# Patient Record
Sex: Female | Born: 1995 | Race: Black or African American | Hispanic: No | Marital: Single | Smoking: Never smoker
Health system: Southern US, Community
[De-identification: ages and names within clinical notes are randomized; demographics above are authoritative.]

## PROBLEM LIST (undated history)

## (undated) ENCOUNTER — Ambulatory Visit (HOSPITAL_COMMUNITY): Admission: EM | Discharge status: Against Medical Advice | Payer: No Typology Code available for payment source

## (undated) DIAGNOSIS — H548 Legal blindness, as defined in USA: Secondary | ICD-10-CM

## (undated) HISTORY — PX: WISDOM TOOTH EXTRACTION: SHX21

---
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
## 2017-07-10 ENCOUNTER — Emergency Department
Admission: EM | Admit: 2017-07-10 | Discharge: 2017-07-11 | Disposition: A | Discharge status: Home / Self Care | Payer: Self-pay | Attending: Emergency Medicine | Admitting: Emergency Medicine

## 2017-07-10 ENCOUNTER — Encounter: Payer: Self-pay | Admitting: Emergency Medicine

## 2017-07-10 DIAGNOSIS — R112 Nausea with vomiting, unspecified: Secondary | ICD-10-CM | POA: Insufficient documentation

## 2017-07-10 DIAGNOSIS — N390 Urinary tract infection, site not specified: Secondary | ICD-10-CM | POA: Insufficient documentation

## 2017-07-10 DIAGNOSIS — R1030 Lower abdominal pain, unspecified: Secondary | ICD-10-CM

## 2017-07-10 DIAGNOSIS — R197 Diarrhea, unspecified: Secondary | ICD-10-CM | POA: Insufficient documentation

## 2017-07-10 HISTORY — DX: Legal blindness, as defined in USA: H54.8

## 2017-07-10 LAB — URINALYSIS, COMPLETE (UACMP) WITH MICROSCOPIC
Bacteria, UA: NONE SEEN
Bilirubin Urine: NEGATIVE
GLUCOSE, UA: NEGATIVE mg/dL
Hgb urine dipstick: NEGATIVE
KETONES UR: NEGATIVE mg/dL
Nitrite: NEGATIVE
PH: 6 (ref 5.0–8.0)
Protein, ur: NEGATIVE mg/dL
RBC / HPF: NONE SEEN RBC/hpf (ref 0–5)
Specific Gravity, Urine: 1.014 (ref 1.005–1.030)

## 2017-07-10 LAB — CBC
HCT: 36.3 % (ref 35.0–47.0)
Hemoglobin: 12.1 g/dL (ref 12.0–16.0)
MCH: 26.1 pg (ref 26.0–34.0)
MCHC: 33.4 g/dL (ref 32.0–36.0)
MCV: 78.3 fL — AB (ref 80.0–100.0)
PLATELETS: 340 10*3/uL (ref 150–440)
RBC: 4.63 MIL/uL (ref 3.80–5.20)
RDW: 16.3 % — AB (ref 11.5–14.5)
WBC: 6.7 10*3/uL (ref 3.6–11.0)

## 2017-07-10 LAB — COMPREHENSIVE METABOLIC PANEL
ALT: 12 U/L — AB (ref 14–54)
AST: 21 U/L (ref 15–41)
Albumin: 4.6 g/dL (ref 3.5–5.0)
Alkaline Phosphatase: 62 U/L (ref 38–126)
Anion gap: 12 (ref 5–15)
BILIRUBIN TOTAL: 0.3 mg/dL (ref 0.3–1.2)
BUN: 20 mg/dL (ref 6–20)
CALCIUM: 9.5 mg/dL (ref 8.9–10.3)
CO2: 21 mmol/L — ABNORMAL LOW (ref 22–32)
CREATININE: 0.96 mg/dL (ref 0.44–1.00)
Chloride: 105 mmol/L (ref 101–111)
GFR calc Af Amer: >60 mL/min (ref >60)
Glucose, Bld: 97 mg/dL (ref 65–99)
Potassium: 3.9 mmol/L (ref 3.5–5.1)
Sodium: 138 mmol/L (ref 135–145)
TOTAL PROTEIN: 9.3 g/dL — AB (ref 6.5–8.1)

## 2017-07-10 LAB — LIPASE, BLOOD: Lipase: 24 U/L (ref 11–51)

## 2017-07-10 NOTE — ED Triage Notes (Signed)
Patient with complaint of vomiting times 4 today. Patient also states that she has had intermittent central abdominal pain times 2 weeks. Patient also states that about 2 weeks ago she had some bleeding in her stool but has not has any blood in her stool since.

## 2017-07-10 NOTE — ED Triage Notes (Signed)
Patient with complaint of sore throat and congestion that started yesterday. Patient reports that this morning with fever of 104, diarrhea and generalized abdominal pain.

## 2017-07-10 NOTE — ED Triage Notes (Signed)
Patient ambulatory to stat desk without any difficulty with c/o N/V/D.

## 2017-07-11 LAB — PREGNANCY, URINE: PREG TEST UR: NEGATIVE

## 2017-07-11 MED ORDER — ONDANSETRON 4 MG PO TBDP: 4.0000 mg | ORAL_TABLET | Freq: Three times a day (TID) | ORAL | 0 refills | Status: DC | PRN

## 2017-07-11 MED ORDER — CEPHALEXIN 500 MG PO CAPS
500.0000 mg | ORAL_CAPSULE | Freq: Once | ORAL | Status: AC
  Administered 2017-07-11: 500 mg via ORAL
  Filled 2017-07-11: qty 1

## 2017-07-11 MED ORDER — CEPHALEXIN 500 MG PO CAPS: 500.0000 mg | ORAL_CAPSULE | Freq: Three times a day (TID) | ORAL | 0 refills | Status: DC

## 2017-07-11 MED ORDER — ONDANSETRON 4 MG PO TBDP
4.0000 mg | ORAL_TABLET | Freq: Once | ORAL | Status: AC
  Administered 2017-07-11: 4 mg via ORAL
  Filled 2017-07-11: qty 1

## 2017-07-11 NOTE — Discharge Instructions (Signed)
1. Take antibiotic as prescribed (Keflex 500 mg 3 times daily 7 days). 2. You may take nausea medicine as needed (Zofran #20). 3. Clear liquids 12 hours, then BRAT diet 3 days, then slowly advance diet as tolerated. 4. Return to the ER for worsening symptoms, persistent vomiting, difficulty breathing or other concerns.

## 2017-07-11 NOTE — ED Provider Notes (Signed)
[facility] Emergency Department Provider Note   ____________________________________________   First MD Initiated Contact with Patient 07/11/17 0015     (approximate)  I have reviewed the triage vital signs and the nursing notes.   HISTORY  Chief Complaint Emesis and Abdominal Pain    HPI Tiffany Flynn is a 21 y.o. female who presents to the ED from home with a chief complaint of N/V/D.patient reports 4 episodes of vomiting and 4 episodes of diarrhea since yesterday morning. Both tapered off in the evening and she was able to eat chicken nuggets approximately 7 PM. She had to leave work at [employer] due to her illness. Complains of suprapubic abdominal pain. Notes that over the past 2 weeks she has had intermittent abdominal cramping. 2 weeks ago she had multiple episodes of loose stool and some red bloody streaks in her stool towards the end of her GI illness. Denies blood in her stool since. Denies associated fever, chills, chest pain, shortness of breath, vaginal discharge or bleeding. Has noted some dysuria. Denies recent travel, trauma or antibiotic use. Of note, I reviewed the triage note complaint of sore throat, congestion and fever. Patient denies all of the above. She is unsure why the triage note registered that complaint.   Past Medical History:  Diagnosis Date  . Legally blind in left eye, as defined in [place]     There are no active problems to display for this patient.   Past Surgical History:  Procedure Laterality Date  . WISDOM TOOTH EXTRACTION      Prior to Admission medications   Medication Sig Start Date End Date Taking? Authorizing Provider  benzonatate (TESSALON PERLES) 100 MG capsule Take 1-2 capsules every 8 hours prn cough 08/03/16   Tommi Rumps, PA-C    Allergies Strawberry (diagnostic)  No family history on file.  Social History Social History  Substance Use Topics  . Smoking status: Never Smoker  . Smokeless  tobacco: Never Used  . Alcohol use No    Review of Systems  Constitutional: No fever/chills. Eyes: No visual changes. ENT: No sore throat. Cardiovascular: Denies chest pain. Respiratory: Denies shortness of breath. Gastrointestinal: positive for abdominal pain, nausea, vomiting and diarrhea.  No constipation. Genitourinary: positive for dysuria. Musculoskeletal: Negative for back pain. Skin: Negative for rash. Neurological: Negative for headaches, focal weakness or numbness.   ____________________________________________   PHYSICAL EXAM:  VITAL SIGNS: ED Triage Vitals  Enc Vitals Group     BP 07/10/17 2029 130/84     Pulse Rate 07/10/17 2029 (!) 108     Resp 07/10/17 2029 18     Temp 07/10/17 2029 98.6 F (37 C)     Temp src --      SpO2 07/10/17 2029 98 %     Weight 07/10/17 2030 220 lb (99.8 kg)     Height 07/10/17 2030  (1.651 m)     Head Circumference --      Peak Flow --      Pain Score 07/10/17 2029 4     Pain Loc --      Pain Edu? --      Excl. in GC? --     Constitutional: Alert and oriented. Well appearing and in no acute distress. Eyes: Conjunctivae are normal. PERRL. EOMI. Head: Atraumatic. Nose: No congestion/rhinnorhea. Mouth/Throat: Mucous membranes are moist.  Oropharynx non-erythematous. Neck: No stridor.   Cardiovascular: Normal rate, regular rhythm. Grossly normal heart sounds.  Good peripheral  circulation. Respiratory: Normal respiratory effort.  No retractions. Lungs CTAB. Gastrointestinal: Soft and nontender to light and deep palpation. No distention. No abdominal bruits. No CVA tenderness. Musculoskeletal: No lower extremity tenderness nor edema.  No joint effusions. Neurologic:  Normal speech and language. No gross focal neurologic deficits are appreciated. No gait instability. Skin:  Skin is warm, dry and intact. No rash noted. Psychiatric: Mood and affect are normal. Speech and behavior are  normal.  ____________________________________________   LABS (all labs ordered are listed, but only abnormal results are displayed)  Labs Reviewed  COMPREHENSIVE METABOLIC PANEL - Abnormal; Notable for the following:       Result Value   CO2 21 (*)    Total Protein 9.3 (*)    ALT 12 (*)    All other components within normal limits  CBC - Abnormal; Notable for the following:    MCV 78.3 (*)    RDW 16.3 (*)    All other components within normal limits  URINALYSIS, COMPLETE (UACMP) WITH MICROSCOPIC - Abnormal; Notable for the following:    Color, Urine YELLOW (*)    APPearance HAZY (*)    Leukocytes, UA MODERATE (*)    Squamous Epithelial / LPF 0-5 (*)    All other components within normal limits  LIPASE, BLOOD  POC URINE PREG, ED   ____________________________________________  EKG  None ____________________________________________  RADIOLOGY  No results found.  ____________________________________________   PROCEDURES  Procedure(s) performed: None  Procedures  Critical Care performed: No  ____________________________________________   INITIAL IMPRESSION / ASSESSMENT AND PLAN / ED COURSE    21 year old female who presents with N/V/D without abdominal tenderness on examination. Differential diagnosis includes, but is not limited to, ovarian cyst, ovarian torsion, acute appendicitis, diverticulitis, urinary tract infection/pyelonephritis, endometriosis, bowel obstruction, colitis, renal colic, gastroenteritis, hernia, pregnancy related pain including ectopic pregnancy, etc. Laboratory and urinalysis results remarkable for moderate leukocytes and TNTC WBC. She denies pregnancy. No pain at McBurney's point. Patient is otherwise well appearing on examination. Will initiate antibiotic treatment with Keflex. She was able to tolerate chicken nuggets for dinner without emesis. Will discharge home with prescriptions for Keflex and Zofran, and patient will follow-up  closely with PCP as needed. Strict return precautions given. Patient and friend verbalize understanding and agrees with plan of care.      ____________________________________________   FINAL CLINICAL IMPRESSION(S) / ED DIAGNOSES  Final diagnoses:  Nausea vomiting and diarrhea  Lower urinary tract infectious disease  Lower abdominal pain      NEW MEDICATIONS STARTED DURING THIS VISIT:  New Prescriptions   No medications on file     Note:  This document was prepared using Dragon voice recognition software and may include unintentional dictation errors.    Irean Hong, MD 07/11/17 [phone]

## 2018-05-24 ENCOUNTER — Ambulatory Visit (HOSPITAL_COMMUNITY)
Admission: EM | Admit: 2018-05-24 | Discharge: 2018-05-24 | Disposition: A | Discharge status: Home / Self Care | Payer: Self-pay | Attending: Family Medicine | Admitting: Family Medicine

## 2018-05-24 ENCOUNTER — Encounter (HOSPITAL_COMMUNITY): Payer: Self-pay

## 2018-05-24 DIAGNOSIS — L500 Allergic urticaria: Secondary | ICD-10-CM

## 2018-05-24 DIAGNOSIS — T7840XA Allergy, unspecified, initial encounter: Secondary | ICD-10-CM

## 2018-05-24 MED ORDER — PREDNISONE 50 MG PO TABS: 50.0000 mg | ORAL_TABLET | Freq: Every day | ORAL | 0 refills | Status: AC

## 2018-05-24 MED ORDER — METHYLPREDNISOLONE SODIUM SUCC 125 MG IJ SOLR
125.0000 mg | Freq: Once | INTRAMUSCULAR | Status: AC
  Administered 2018-05-24: 125 mg via INTRAMUSCULAR

## 2018-05-24 MED ORDER — METHYLPREDNISOLONE SODIUM SUCC 125 MG IJ SOLR
INTRAMUSCULAR | Status: AC
  Filled 2018-05-24: qty 2

## 2018-05-24 NOTE — ED Provider Notes (Signed)
[facility]   [id number] 05/24/18 Arrival Time: 1734  CC: SKIN COMPLAINT  SUBJECTIVE:  Tiffany Flynn is a 22 y.o. female who presents with improving hives and facial swelling that began 5 days ago.  Denies known trigger, change in products she uses on a daily basis, or starting a new medication.  Has tried benadryl with relief.  Denies worsening symptoms.  Reports similar symptoms in the past that resolved with benadryl.   Complains decreased appetite.  Denies fever, chills, nausea, vomiting, erythema, lip swelling, tongue swelling, lips tingling, tongue tingling, throat swelling, dyspnea, SOB, chest pain, abdominal pain, changes in bowel or bladder function.    ROS: As per HPI.  Past Medical History:  Diagnosis Date  . Legally blind in left eye, as defined in [place]    Past Surgical History:  Procedure Laterality Date  . WISDOM TOOTH EXTRACTION     Allergies  Allergen Reactions  . Strawberry (Diagnostic)   . Tomato    No current facility-administered medications on file prior to encounter.    No current outpatient medications on file prior to encounter.   Social History   Socioeconomic History  . Marital status: Single    Spouse name: Not on file  . Number of children: Not on file  . Years of education: Not on file  . Highest education level: Not on file  Occupational History  . Not on file  Social Needs  . Financial resource strain: Not on file  . Food insecurity:    Worry: Not on file    Inability: Not on file  . Transportation needs:    Medical: Not on file    Non-medical: Not on file  Tobacco Use  . Smoking status: Never Smoker  . Smokeless tobacco: Never Used  Substance and Sexual Activity  . Alcohol use: No  . Drug use: No  . Sexual activity: Not on file  Lifestyle  . Physical activity:    Days per week: Not on file    Minutes per session: Not on file  . Stress: Not on file  Relationships  . Social connections:    Talks on phone: Not on  file    Gets together: Not on file    Attends religious service: Not on file    Active member of club or organization: Not on file    Attends meetings of clubs or organizations: Not on file    Relationship status: Not on file  . Intimate partner violence:    Fear of current or ex partner: Not on file    Emotionally abused: Not on file    Physically abused: Not on file    Forced sexual activity: Not on file  Other Topics Concern  . Not on file  Social History Narrative  . Not on file   Family History  Problem Relation Age of Onset  . Healthy Mother   . Healthy Father     OBJECTIVE: Vitals:   05/24/18 1755  BP: (!) 149/98  Pulse: 81  Resp: 18  Temp: 98.5 F (36.9 C)  SpO2: 100%    General appearance: alert; no distress; speaking in full sentences and tolerating own secretions without difficulty HEENT: NCAT; no obvious facial swelling PERRL; Nose: nares patent; Mouth: oropharynx clear, no obvious lip, tongue, or soft palate swelling Lungs: clear to auscultation bilaterally Heart: regular rate and rhythm.  Radial pulse 2+ bilaterally Extremities: no edema Skin: warm and dry; no obvious urticarial rash seen Psychological: alert and  cooperative; normal mood and affect  ASSESSMENT & PLAN:  1. Allergic reaction, initial encounter     Meds ordered this encounter  Medications  . methylPREDNISolone sodium succinate (SOLU-MEDROL) 125 mg/2 mL injection 125 mg  . predniSONE (DELTASONE) 50 MG tablet    Sig: Take 1 tablet (50 mg total) by mouth daily for 5 days.    Dispense:  5 tablet    Refill:  0    Order Specific Question:   Supervising Provider    Answer:   Isa RankinMURRAY, LAURA WILSON [[id number]]   Steroid shot given in office Prednisone prescribed.  Take as directed and to completion Continue taking benadryl until complete resolution of symptoms Go to the ED if you experience any new or worsening symptoms such as difficulty breathing, difficulty swallowing, tongue or lip swelling  or tingling, vomiting, chest pain, shortness of breath, changes in bowel or bladder habits, worsening skin manifestations, altered mental status, etc..  Reviewed expectations re: course of current medical issues. Questions answered. Outlined signs and symptoms indicating need for more acute intervention. Patient verbalized understanding. After Visit Summary given.   Rennis HardingWurst, Rashan Rounsaville, PA-C 05/24/18 [phone]

## 2018-05-24 NOTE — Discharge Instructions (Addendum)
Steroid shot given in office Prednisone prescribed.  Take as directed and to completion Continue taking benadryl until complete resolution of symptoms Go to the ED if you experience any new or worsening symptoms such as difficulty breathing, difficulty swallowing, tongue or lip swelling or tingling, vomiting, chest pain, shortness of breath, changes in bowel or bladder habits, worsening skin manifestations, altered mental status, etc..

## 2018-05-24 NOTE — ED Triage Notes (Signed)
Pt presents with off and on hives and facial swelling. Unsure what she is having a reaction to. Pt is in no distress in triage.

## 2018-06-22 ENCOUNTER — Emergency Department (HOSPITAL_COMMUNITY)
Admission: EM | Admit: 2018-06-22 | Discharge: 2018-06-22 | Disposition: A | Discharge status: Home / Self Care | Payer: Medicaid Other | Attending: Emergency Medicine | Admitting: Emergency Medicine

## 2018-06-22 ENCOUNTER — Other Ambulatory Visit: Payer: Self-pay

## 2018-06-22 ENCOUNTER — Encounter (HOSPITAL_COMMUNITY): Payer: Self-pay

## 2018-06-22 DIAGNOSIS — F410 Panic disorder [episodic paroxysmal anxiety] without agoraphobia: Secondary | ICD-10-CM | POA: Insufficient documentation

## 2018-06-22 MED ORDER — ALPRAZOLAM 0.5 MG PO TABS: 0.5000 mg | ORAL_TABLET | Freq: Three times a day (TID) | ORAL | 0 refills | Status: AC | PRN

## 2018-06-22 NOTE — ED Triage Notes (Signed)
Pt states that she had two panic attacks today but she does not know why or what she was doing prior to, pt denies SI/HI/AVH, pt states he legs went numb and they are better now, has no other complaints. Denies drug and ETOH use

## 2018-06-22 NOTE — ED Provider Notes (Signed)
[facility] EMERGENCY DEPARTMENT Provider Note   CSN: [id number] Arrival date & time: 06/22/18  0246     History   Chief Complaint Chief Complaint  Patient presents with  . Panic Attack    HPI Tiffany Flynn is a 22 y.o. female.  Patient is a 22 year old female with history of anxiety.  She presents with complaints of frequent panic attacks.  This is been occurring over the past couple of days and states that she had 2 panic attacks this evening.  She describes episodes where she becomes tight in the chest, short of breath, and hyperventilates.  She also becomes shaky and numb in her hands.  She has had these in the past, however never this frequent.  She denies any recent stressful situations in her life.  She denies any other complaints.  Symptoms have resolved and she now feels fine.  The history is provided by the patient.    Past Medical History:  Diagnosis Date  . Legally blind in left eye, as defined in [place]     There are no active problems to display for this patient.   Past Surgical History:  Procedure Laterality Date  . WISDOM TOOTH EXTRACTION       OB History   None      Home Medications    Prior to Admission medications   Medication Sig Start Date End Date Taking? Authorizing Provider  ALPRAZolam Prudy Feeler) 0.5 MG tablet Take 1 tablet (0.5 mg total) by mouth 3 (three) times daily as needed for anxiety. 06/22/18   Geoffery Lyons, MD    Family History Family History  Problem Relation Age of Onset  . Healthy Mother   . Healthy Father     Social History Social History   Tobacco Use  . Smoking status: Never Smoker  . Smokeless tobacco: Never Used  Substance Use Topics  . Alcohol use: No  . Drug use: No     Allergies   Strawberry (diagnostic) and Tomato   Review of Systems Review of Systems  All other systems reviewed and are negative.    Physical Exam Updated Vital Signs BP 113/74 (BP Location: Left Arm)   Pulse 61    Temp 98.6 F (37 C) (Oral)   Resp 12   Ht 5\' 4"  (1.626 m)   LMP 05/23/2018   SpO2 97%   BMI 37.76 kg/m   Physical Exam  Constitutional: She is oriented to person, place, and time. She appears well-developed and well-nourished. No distress.  HENT:  Head: Normocephalic and atraumatic.  Neck: Normal range of motion. Neck supple.  Cardiovascular: Normal rate and regular rhythm. Exam reveals no gallop and no friction rub.  No murmur heard. Pulmonary/Chest: Effort normal and breath sounds normal. No respiratory distress. She has no wheezes.  Abdominal: Soft. Bowel sounds are normal. She exhibits no distension. There is no tenderness.  Musculoskeletal: Normal range of motion.  Neurological: She is alert and oriented to person, place, and time.  Skin: Skin is warm and dry. She is not diaphoretic.  Nursing note and vitals reviewed.    ED Treatments / Results  Labs (all labs ordered are listed, but only abnormal results are displayed) Labs Reviewed - No data to display  EKG None  Radiology No results found.  Procedures Procedures (including critical care time)  Medications Ordered in ED Medications - No data to display   Initial Impression / Assessment and Plan / ED Course  I have reviewed the triage  vital signs and the nursing notes.  Pertinent labs & imaging results that were available during my care of the patient were reviewed by me and considered in my medical decision making (see chart for details).  Patient presents with what appear to be classic panic attacks.  She will be given a small quantity of Xanax and advised to follow-up with her primary doctor.  Final Clinical Impressions(s) / ED Diagnoses   Final diagnoses:  Panic attack    ED Discharge Orders         Ordered    ALPRAZolam (XANAX) 0.5 MG tablet  3 times daily PRN     06/22/18 0444           Geoffery Lyonselo, Brodric Schauer, MD 06/22/18 0700

## 2018-06-22 NOTE — Discharge Instructions (Addendum)
Xanax as prescribed as needed for panic.  Follow-up with your primary doctor in the next week for a recheck, and return to the ER if symptoms significantly worsen or change.

## 2019-05-11 ENCOUNTER — Encounter (HOSPITAL_COMMUNITY): Payer: Self-pay

## 2019-05-11 ENCOUNTER — Other Ambulatory Visit: Payer: Self-pay

## 2019-05-11 ENCOUNTER — Emergency Department (HOSPITAL_COMMUNITY)
Admission: EM | Admit: 2019-05-11 | Discharge: 2019-05-11 | Disposition: A | Discharge status: Home / Self Care | Payer: Self-pay | Attending: Emergency Medicine | Admitting: Emergency Medicine

## 2019-05-11 ENCOUNTER — Emergency Department (HOSPITAL_COMMUNITY): Payer: Self-pay

## 2019-05-11 DIAGNOSIS — Y929 Unspecified place or not applicable: Secondary | ICD-10-CM | POA: Insufficient documentation

## 2019-05-11 DIAGNOSIS — W25XXXA Contact with sharp glass, initial encounter: Secondary | ICD-10-CM | POA: Insufficient documentation

## 2019-05-11 DIAGNOSIS — S91311A Laceration without foreign body, right foot, initial encounter: Secondary | ICD-10-CM | POA: Insufficient documentation

## 2019-05-11 DIAGNOSIS — Y9301 Activity, walking, marching and hiking: Secondary | ICD-10-CM | POA: Insufficient documentation

## 2019-05-11 DIAGNOSIS — Z23 Encounter for immunization: Secondary | ICD-10-CM | POA: Insufficient documentation

## 2019-05-11 DIAGNOSIS — Y999 Unspecified external cause status: Secondary | ICD-10-CM | POA: Insufficient documentation

## 2019-05-11 MED ORDER — TETANUS-DIPHTH-ACELL PERTUSSIS 5-2.5-18.5 LF-MCG/0.5 IM SUSP
0.5000 mL | Freq: Once | INTRAMUSCULAR | Status: AC
  Administered 2019-05-11: 0.5 mL via INTRAMUSCULAR
  Filled 2019-05-11: qty 0.5

## 2019-05-11 MED ORDER — CEPHALEXIN 250 MG PO CAPS
250.0000 mg | ORAL_CAPSULE | Freq: Once | ORAL | Status: AC
  Administered 2019-05-11: 250 mg via ORAL
  Filled 2019-05-11: qty 1

## 2019-05-11 MED ORDER — CEPHALEXIN 250 MG PO CAPS: 250.0000 mg | ORAL_CAPSULE | Freq: Four times a day (QID) | ORAL | 0 refills | Status: AC

## 2019-05-11 MED ORDER — IBUPROFEN 800 MG PO TABS
800.0000 mg | ORAL_TABLET | Freq: Once | ORAL | Status: AC
  Administered 2019-05-11: 23:00:00 800 mg via ORAL
  Filled 2019-05-11: qty 1

## 2019-05-11 NOTE — ED Provider Notes (Signed)
[facility] Provider Note   CSN: [id number] Arrival date & time: 05/11/19  2144    History   Chief Complaint Chief Complaint  Patient presents with  . Laceration    HPI Tiffany Flynn is a 23 y.o. female presenting for evaluation of right foot pain.  Patient states approximately 7 hours prior to arrival she accidentally stepped on a glass cup which broke and cut the bottom of her foot.  Since then, she states it has been difficult for her to walk because she feels like something is in her foot.  She denies injury elsewhere.  She has not taken anything for pain including Tylenol ibuprofen.  Pain is constant, worse with ambulation and weightbearing.  Nothing makes it better.  She denies numbness or tingling.  She has no medical problems, takes medications daily.  She is not on blood thinners.  She does not know when her last tetanus shot was.     HPI  Past Medical History:  Diagnosis Date  . Legally blind in left eye, as defined in [place]     There are no active problems to display for this patient.   Past Surgical History:  Procedure Laterality Date  . WISDOM TOOTH EXTRACTION       OB History   No obstetric history on file.      Home Medications    Prior to Admission medications   Medication Sig Start Date End Date Taking? Authorizing Provider  ALPRAZolam Prudy Feeler(XANAX) 0.5 MG tablet Take 1 tablet (0.5 mg total) by mouth 3 (three) times daily as needed for anxiety. 06/22/18   Geoffery Lyonselo, [place], MD  cephALEXin (KEFLEX) 250 MG capsule Take 1 capsule (250 mg total) by mouth 4 (four) times daily for 3 days. 05/11/19 05/14/19  Leslee Suire, PA-C    Family History Family History  Problem Relation Age of Onset  . Healthy Mother   . Healthy Father     Social History Social History   Tobacco Use  . Smoking status: Never Smoker  . Smokeless tobacco: Never Used  Substance Use Topics  . Alcohol use: No    Comment: occassionally   . Drug  use: No     Allergies   Strawberry (diagnostic) and Tomato   Review of Systems Review of Systems  Skin: Positive for wound.  Hematological: Does not bruise/bleed easily.     Physical Exam Updated Vital Signs BP (!) 129/108 (BP Location: Right Arm)   Pulse 79   Temp 98.4 F (36.9 C) (Oral)   Resp 20   SpO2 99%   Physical Exam Vitals signs and nursing note reviewed.  Constitutional:      General: She is not in acute distress.    Appearance: She is well-developed.  HENT:     Head: Normocephalic and atraumatic.  Neck:     Musculoskeletal: Normal range of motion.  Pulmonary:     Effort: Pulmonary effort is normal.  Abdominal:     General: There is no distension.  Musculoskeletal: Normal range of motion.       Feet:  Feet:     Comments: Small, ~ 1 cm, laceration to the plantar right foot without active bleeding. 1-2 mm deep.  Tenderness palpation surrounding and overlying the laceration.  No obvious foreign body. Skin:    General: Skin is warm.     Findings: No rash.  Neurological:     Mental Status: She is alert and oriented to person, place, and time.  ED Treatments / Results  Labs (all labs ordered are listed, but only abnormal results are displayed) Labs Reviewed - No data to display  EKG None  Radiology Dg Foot 2 Views Right  Result Date: 05/11/2019 CLINICAL DATA:  Stepped on glass with plantar laceration, initial encounter EXAM: RIGHT FOOT - 2 VIEW COMPARISON:  None. FINDINGS: No acute fracture or dislocation is noted. Soft tissue defect is noted although no definitive radiopaque foreign body is seen. IMPRESSION: No bony abnormality is noted. No definitive radiopaque foreign body is seen. Glass fragments are typically radiolucent however. Electronically Signed   By: Inez Catalina M.D.   On: 05/11/2019 23:05    Procedures Procedures (including critical care time)  Medications Ordered in ED Medications  Tdap (BOOSTRIX) injection 0.5 mL (0.5 mLs  Intramuscular Given 05/11/19 2223)  ibuprofen (ADVIL) tablet 800 mg (800 mg Oral Given 05/11/19 2237)  cephALEXin (KEFLEX) capsule 250 mg (250 mg Oral Given 05/11/19 2333)     Initial Impression / Assessment and Plan / ED Course  I have reviewed the triage vital signs and the nursing notes.  Pertinent labs & imaging results that were available during my care of the patient were reviewed by me and considered in my medical decision making (see chart for details).        Patient presenting for evaluation for laceration.  Physical examination, she is neurovascularly intact.  Laceration is superficial without active bleeding.  No obvious foreign body.  Will obtain x-ray for evaluation.  Actively interpreted by me, no obvious foreign body or glass.  Wound cleaned, no palpable foreign bodies.  Dressing applied.  Patient requesting crutches for ambulation.  She has a foot wound, will place on prophylactic antibiotics, although low suspicion for infection at this time.  Discussed symptomatic treatment Tylenol and ibuprofen.  Ice for pain and swelling.  Follow-up with podiatry as needed.  At this time, patient appears safe for discharge.  Return precautions given.  Patient states she understands and agrees to plan.   Final Clinical Impressions(s) / ED Diagnoses   Final diagnoses:  Laceration of right foot, initial encounter    ED Discharge Orders         Ordered    cephALEXin (KEFLEX) 250 MG capsule  4 times daily     05/11/19 2322           Franchot Heidelberg, PA-C 05/11/19 2340    Sherwood Gambler, MD 05/19/19 [phone]

## 2019-05-11 NOTE — ED Notes (Signed)
Patient verbalizes understanding of discharge instructions. Opportunity for questioning and answers were provided. Armband removed by staff, pt discharged from ED.  

## 2019-05-11 NOTE — ED Triage Notes (Addendum)
Pt from home w/ a c/o a laceration and pain to the bottom of her right foot. She reports stepping on a glass ash tray. The ash tray broke and she noted shards of glass. Unknown if there is glass in the laceration. Bleeding controlled PTA. Laceration less than a cm in length.

## 2019-05-11 NOTE — Discharge Instructions (Addendum)
Antibiotics as prescribed.  Take entire course, even if your symptoms improve. Use Tylenol and ibuprofen to help with pain. Use ice to help with pain and swelling. Use crutches as needed for pain control when walking.  It is not dangerous to walk in your foot, however. Follow-up with your foot doctor listed below if your pain is not improving or if you have signs of infection. Return to the emergency room if you develop severe worsening pain, fevers, pus any from the area, numbness, or any new, worsening, concerning symptoms.

## 2019-05-24 ENCOUNTER — Other Ambulatory Visit: Payer: Self-pay

## 2019-05-24 ENCOUNTER — Emergency Department (HOSPITAL_COMMUNITY)
Admission: EM | Admit: 2019-05-24 | Discharge: 2019-05-24 | Disposition: A | Discharge status: Home / Self Care | Payer: Medicaid Other | Attending: Emergency Medicine | Admitting: Emergency Medicine

## 2019-05-24 ENCOUNTER — Encounter (HOSPITAL_COMMUNITY): Payer: Self-pay

## 2019-05-24 DIAGNOSIS — Z5321 Procedure and treatment not carried out due to patient leaving prior to being seen by health care provider: Secondary | ICD-10-CM | POA: Insufficient documentation

## 2019-05-24 DIAGNOSIS — R1110 Vomiting, unspecified: Secondary | ICD-10-CM | POA: Insufficient documentation

## 2019-05-24 DIAGNOSIS — R1031 Right lower quadrant pain: Secondary | ICD-10-CM | POA: Insufficient documentation

## 2019-05-24 LAB — COMPREHENSIVE METABOLIC PANEL
ALT: 12 U/L (ref 0–44)
AST: 20 U/L (ref 15–41)
Albumin: 4.3 g/dL (ref 3.5–5.0)
Alkaline Phosphatase: 59 U/L (ref 38–126)
Anion gap: 17 — ABNORMAL HIGH (ref 5–15)
BUN: 8 mg/dL (ref 6–20)
CO2: 18 mmol/L — ABNORMAL LOW (ref 22–32)
Calcium: 9.6 mg/dL (ref 8.9–10.3)
Chloride: 106 mmol/L (ref 98–111)
Creatinine, Ser: 1.04 mg/dL — ABNORMAL HIGH (ref 0.44–1.00)
GFR calc Af Amer: >60 mL/min (ref >60)
GFR calc non Af Amer: >60 mL/min (ref >60)
Glucose, Bld: 122 mg/dL — ABNORMAL HIGH (ref 70–99)
Potassium: 3 mmol/L — ABNORMAL LOW (ref 3.5–5.1)
Sodium: 141 mmol/L (ref 135–145)
Total Bilirubin: 0.3 mg/dL (ref 0.3–1.2)
Total Protein: 8.8 g/dL — ABNORMAL HIGH (ref 6.5–8.1)

## 2019-05-24 LAB — CBC
HCT: 38.3 % (ref 36.0–46.0)
Hemoglobin: 12.3 g/dL (ref 12.0–15.0)
MCH: 26.1 pg (ref 26.0–34.0)
MCHC: 32.1 g/dL (ref 30.0–36.0)
MCV: 81.1 fL (ref 80.0–100.0)
Platelets: 354 10*3/uL (ref 150–400)
RBC: 4.72 MIL/uL (ref 3.87–5.11)
RDW: 15.7 % — ABNORMAL HIGH (ref 11.5–15.5)
WBC: 7.1 10*3/uL (ref 4.0–10.5)
nRBC: 0 % (ref 0.0–0.2)

## 2019-05-24 LAB — I-STAT BETA HCG BLOOD, ED (MC, WL, AP ONLY): I-stat hCG, quantitative: <5 m[IU]/mL (ref <5)

## 2019-05-24 LAB — LIPASE, BLOOD: Lipase: 21 U/L (ref 11–51)

## 2019-05-24 MED ORDER — ONDANSETRON 4 MG PO TBDP
4.0000 mg | ORAL_TABLET | Freq: Once | ORAL | Status: AC | PRN
  Administered 2019-05-24: 4 mg via ORAL
  Filled 2019-05-24: qty 1

## 2019-05-24 MED ORDER — SODIUM CHLORIDE 0.9% FLUSH: 3.0000 mL | Freq: Once | INTRAVENOUS | Status: DC

## 2019-05-24 NOTE — ED Triage Notes (Signed)
Pt reports RLQ abdominal pain that started today. She reports that she took 8 ibuprofen and 6 shots of vodka for pain. Endorses vomiting.

## 2020-02-18 ENCOUNTER — Emergency Department (HOSPITAL_COMMUNITY)
Admission: EM | Admit: 2020-02-18 | Discharge: 2020-02-18 | Disposition: A | Discharge status: Home / Self Care | Payer: Self-pay | Attending: Emergency Medicine | Admitting: Emergency Medicine

## 2020-02-18 ENCOUNTER — Emergency Department (HOSPITAL_COMMUNITY): Payer: Self-pay

## 2020-02-18 DIAGNOSIS — R410 Disorientation, unspecified: Secondary | ICD-10-CM | POA: Insufficient documentation

## 2020-02-18 DIAGNOSIS — Z79899 Other long term (current) drug therapy: Secondary | ICD-10-CM | POA: Insufficient documentation

## 2020-02-18 DIAGNOSIS — Y92410 Unspecified street and highway as the place of occurrence of the external cause: Secondary | ICD-10-CM | POA: Insufficient documentation

## 2020-02-18 DIAGNOSIS — Y999 Unspecified external cause status: Secondary | ICD-10-CM | POA: Insufficient documentation

## 2020-02-18 DIAGNOSIS — F4489 Other dissociative and conversion disorders: Secondary | ICD-10-CM

## 2020-02-18 DIAGNOSIS — Y93I9 Activity, other involving external motion: Secondary | ICD-10-CM | POA: Insufficient documentation

## 2020-02-18 LAB — COMPREHENSIVE METABOLIC PANEL
ALT: 11 U/L (ref 0–44)
AST: 13 U/L — ABNORMAL LOW (ref 15–41)
Albumin: 4 g/dL (ref 3.5–5.0)
Alkaline Phosphatase: 55 U/L (ref 38–126)
Anion gap: 10 (ref 5–15)
BUN: 5 mg/dL — ABNORMAL LOW (ref 6–20)
CO2: 21 mmol/L — ABNORMAL LOW (ref 22–32)
Calcium: 9.4 mg/dL (ref 8.9–10.3)
Chloride: 106 mmol/L (ref 98–111)
Creatinine, Ser: 0.99 mg/dL (ref 0.44–1.00)
GFR calc Af Amer: >60 mL/min (ref >60)
GFR calc non Af Amer: >60 mL/min (ref >60)
Glucose, Bld: 91 mg/dL (ref 70–99)
Potassium: 4.1 mmol/L (ref 3.5–5.1)
Sodium: 137 mmol/L (ref 135–145)
Total Bilirubin: 0.5 mg/dL (ref 0.3–1.2)
Total Protein: 7.7 g/dL (ref 6.5–8.1)

## 2020-02-18 LAB — CBC WITH DIFFERENTIAL/PLATELET
Abs Immature Granulocytes: 0.01 10*3/uL (ref 0.00–0.07)
Basophils Absolute: 0 10*3/uL (ref 0.0–0.1)
Basophils Relative: 0 %
Eosinophils Absolute: 0.1 10*3/uL (ref 0.0–0.5)
Eosinophils Relative: 2 %
HCT: 37.9 % (ref 36.0–46.0)
Hemoglobin: 12.4 g/dL (ref 12.0–15.0)
Immature Granulocytes: 0 %
Lymphocytes Relative: 34 %
Lymphs Abs: 1.8 10*3/uL (ref 0.7–4.0)
MCH: 27.7 pg (ref 26.0–34.0)
MCHC: 32.7 g/dL (ref 30.0–36.0)
MCV: 84.6 fL (ref 80.0–100.0)
Monocytes Absolute: 0.7 10*3/uL (ref 0.1–1.0)
Monocytes Relative: 14 %
Neutro Abs: 2.5 10*3/uL (ref 1.7–7.7)
Neutrophils Relative %: 50 %
Platelets: 299 10*3/uL (ref 150–400)
RBC: 4.48 MIL/uL (ref 3.87–5.11)
RDW: 15.5 % (ref 11.5–15.5)
WBC: 5.2 10*3/uL (ref 4.0–10.5)
nRBC: 0 % (ref 0.0–0.2)

## 2020-02-18 LAB — URINALYSIS, ROUTINE W REFLEX MICROSCOPIC
Bilirubin Urine: NEGATIVE
Glucose, UA: NEGATIVE mg/dL
Ketones, ur: NEGATIVE mg/dL
Leukocytes,Ua: NEGATIVE
Nitrite: NEGATIVE
Protein, ur: NEGATIVE mg/dL
Specific Gravity, Urine: 1.004 — ABNORMAL LOW (ref 1.005–1.030)
pH: 6 (ref 5.0–8.0)

## 2020-02-18 LAB — RAPID URINE DRUG SCREEN, HOSP PERFORMED
Amphetamines: NOT DETECTED
Barbiturates: NOT DETECTED
Benzodiazepines: NOT DETECTED
Cocaine: NOT DETECTED
Opiates: NOT DETECTED
Tetrahydrocannabinol: POSITIVE — AB

## 2020-02-18 LAB — HCG, SERUM, QUALITATIVE: Preg, Serum: NEGATIVE

## 2020-02-18 LAB — ETHANOL: Alcohol, Ethyl (B): <10 mg/dL (ref <10)

## 2020-02-18 LAB — CBG MONITORING, ED: Glucose-Capillary: 89 mg/dL (ref 70–99)
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
## 2020-02-18 NOTE — ED Notes (Signed)
Pt able to ambulate to and from bathroom without assistance. No impaired gait
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
## 2020-02-18 NOTE — ED Notes (Signed)
Pt transported to CT scan.

## 2020-02-26 ENCOUNTER — Ambulatory Visit: Payer: No Typology Code available for payment source | Admitting: Neurology

## 2020-02-26 ENCOUNTER — Encounter: Payer: Self-pay | Admitting: Neurology

## 2020-03-01 ENCOUNTER — Telehealth: Payer: Self-pay

## 2020-03-01 NOTE — Telephone Encounter (Signed)
Patient no show for new patient appointment on Feb 26, 2020.

## 2021-01-31 ENCOUNTER — Ambulatory Visit (HOSPITAL_COMMUNITY): Payer: Self-pay
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
## 2022-08-30 ENCOUNTER — Emergency Department: Payer: Self-pay

## 2022-08-30 ENCOUNTER — Emergency Department
Admission: EM | Admit: 2022-08-30 | Discharge: 2022-08-31 | Disposition: A | Discharge status: Home / Self Care | Payer: Self-pay | Attending: Emergency Medicine | Admitting: Emergency Medicine

## 2022-08-30 DIAGNOSIS — R000 Tachycardia, unspecified: Secondary | ICD-10-CM | POA: Insufficient documentation

## 2022-08-30 DIAGNOSIS — R251 Tremor, unspecified: Secondary | ICD-10-CM | POA: Insufficient documentation

## 2022-08-30 DIAGNOSIS — F1290 Cannabis use, unspecified, uncomplicated: Secondary | ICD-10-CM | POA: Insufficient documentation

## 2022-08-30 DIAGNOSIS — R569 Unspecified convulsions: Secondary | ICD-10-CM | POA: Insufficient documentation

## 2022-08-30 DIAGNOSIS — F445 Conversion disorder with seizures or convulsions: Secondary | ICD-10-CM

## 2022-08-30 DIAGNOSIS — N390 Urinary tract infection, site not specified: Secondary | ICD-10-CM | POA: Insufficient documentation

## 2022-08-30 LAB — CBC
HCT: 36.9 % (ref 36.0–46.0)
Hemoglobin: 12.4 g/dL (ref 12.0–15.0)
MCH: 28.6 pg (ref 26.0–34.0)
MCHC: 33.6 g/dL (ref 30.0–36.0)
MCV: 85 fL (ref 80.0–100.0)
Platelets: 237 10*3/uL (ref 150–400)
RBC: 4.34 MIL/uL (ref 3.87–5.11)
RDW: 13.1 % (ref 11.5–15.5)
WBC: 6.7 10*3/uL (ref 4.0–10.5)
nRBC: 0 % (ref 0.0–0.2)

## 2022-08-30 MED ORDER — LORAZEPAM 2 MG/ML IJ SOLN
2.0000 mg | Freq: Once | INTRAMUSCULAR | Status: AC
  Administered 2022-08-30: 2 mg via INTRAVENOUS
  Filled 2022-08-30: qty 1

## 2022-08-30 MED ORDER — HALOPERIDOL LACTATE 5 MG/ML IJ SOLN
5.0000 mg | Freq: Once | INTRAMUSCULAR | Status: AC
  Administered 2022-08-30: 5 mg via INTRAVENOUS
  Filled 2022-08-30: qty 1

## 2022-08-30 MED ORDER — SODIUM CHLORIDE 0.9 % IV BOLUS
1000.0000 mL | Freq: Once | INTRAVENOUS | Status: AC
  Administered 2022-08-30: 1000 mL via INTRAVENOUS

## 2022-08-30 NOTE — ED Provider Notes (Signed)
[facility] Provider Note    Event Date/Time   First MD Initiated Contact with Patient 08/30/22 2327     (approximate)   History   Seizures   HPI  Level V caveat: Patient nonverbal  Tiffany Flynn is a 26 y.o. female brought to the ED via EMS from work with a chief complaint of possible seizure.  Coworkers found patient staring with shaking activity of her upper limbs.  She was standing and did not fall.  EMS administered IM Versed.  History of anxiety.  No history of seizures.  Rest of history is limited secondary to patient's nonverbal state.     Past Medical History   Past Medical History:  Diagnosis Date   Legally blind in left eye, as defined in [place]      Active Problem List  There are no problems to display for this patient.    Past Surgical History   Past Surgical History:  Procedure Laterality Date   WISDOM TOOTH EXTRACTION       Home Medications   Prior to Admission medications   Medication Sig Start Date End Date Taking? Authorizing Provider  cephALEXin (KEFLEX) 500 MG capsule Take 1 capsule (500 mg total) by mouth 3 (three) times daily. 08/31/22  Yes Irean Hong, MD  LORazepam (ATIVAN) 1 MG tablet Take 1 tablet (1 mg total) by mouth every 8 (eight) hours as needed for anxiety. 08/31/22  Yes Irean Hong, MD  ALPRAZolam Prudy Feeler) 0.5 MG tablet Take 1 tablet (0.5 mg total) by mouth 3 (three) times daily as needed for anxiety. Patient not taking: Reported on 05/24/2019 06/22/18   Geoffery Lyons, MD  ibuprofen (ADVIL) 200 MG tablet Take 800 mg by mouth every 6 (six) hours as needed for moderate pain.    [provider]     Allergies  Tomato and Strawberry (diagnostic)   Family History   Family History  Problem Relation Age of Onset   Healthy Mother    Healthy Father      Physical Exam  Triage Vital Signs: ED Triage Vitals  Enc Vitals Group     BP      Pulse      Resp      Temp      Temp src      SpO2       Weight      Height      Head Circumference      Peak Flow      Pain Score      Pain Loc      Pain Edu?      Excl. in GC?     Updated Vital Signs: BP (!) 123/93   Pulse 70   Temp 97.8 F (36.6 C) (Oral)   Resp 17   SpO2 97%    General: Awake, no distress.  Eyes open and staring, nonverbal but nods head yes or no to questioning and follows commands.  Generalized shaking. CV:  Tachycardic good peripheral perfusion.  Resp:  Normal effort.  Abd:  No distention.  Other:  Eyes open spontaneously and staring.  Cannot or will not speak.  Generalized shaking but following commands and nodding head yes or no to questions. MAEx4.   ED Results / Procedures / Treatments  Labs (all labs ordered are listed, but only abnormal results are displayed) Labs Reviewed  ACETAMINOPHEN LEVEL - Abnormal; Notable for the following components:      Result Value  Acetaminophen (Tylenol), Serum <10 (*)    All other components within normal limits  URINALYSIS, ROUTINE W REFLEX MICROSCOPIC - Abnormal; Notable for the following components:   Color, Urine YELLOW (*)    APPearance HAZY (*)    Nitrite POSITIVE (*)    Leukocytes,Ua TRACE (*)    Bacteria, UA FEW (*)    All other components within normal limits  URINE DRUG SCREEN, QUALITATIVE ([facility] ONLY) - Abnormal; Notable for the following components:   Cannabinoid 50 Ng, Ur [place] POSITIVE (*)    Benzodiazepine, Ur Scrn POSITIVE (*)    All other components within normal limits  BASIC METABOLIC PANEL - Abnormal; Notable for the following components:   Chloride 112 (*)    Calcium 8.3 (*)    All other components within normal limits  SALICYLATE LEVEL - Abnormal; Notable for the following components:   Salicylate Lvl <7.0 (*)    All other components within normal limits  CBC  ETHANOL  HCG, QUANTITATIVE, PREGNANCY     EKG  ED ECG REPORT I, Terry Abila J, the attending physician, personally viewed and interpreted this ECG.   Date: 08/30/2022  EKG  Time: 2333  Rate: 112  Rhythm: sinus tachycardia  Axis: LAD  Intervals:none  ST&T Change: Nonspecific    RADIOLOGY I have independently visualized and interpreted patient's CT and chest x-ray as well as noted the radiology interpretation:  CT head: No ICH  Chest x-ray: No acute cardiopulmonary process  Official radiology report(s): CT Head Wo Contrast  Result Date: 08/31/2022 CLINICAL DATA:  Mental status change, unknown cause EXAM: CT HEAD WITHOUT CONTRAST TECHNIQUE: Contiguous axial images were obtained from the base of the skull through the vertex without intravenous contrast. RADIATION DOSE REDUCTION: This exam was performed according to the departmental dose-optimization program which includes automated exposure control, adjustment of the mA and/or kV according to patient size and/or use of iterative reconstruction technique. COMPARISON:  02/18/2020 FINDINGS: Brain: No acute intracranial abnormality. Specifically, no hemorrhage, hydrocephalus, mass lesion, acute infarction, or significant intracranial injury. Vascular: No hyperdense vessel or unexpected calcification. Skull: No acute calvarial abnormality. Sinuses/Orbits: No acute findings Other: None IMPRESSION: Normal study. Electronically Signed   By: Charlett Nose M.D.   On: 08/31/2022 02:04   DG Chest Port 1 View  Result Date: 08/31/2022 CLINICAL DATA:  Seizure. EXAM: PORTABLE CHEST 1 VIEW COMPARISON:  Chest x-ray 02/18/2020 FINDINGS: The heart size and mediastinal contours are within normal limits. Both lungs are clear. The visualized skeletal structures are unremarkable. IMPRESSION: No active disease. Electronically Signed   By: Darliss Cheney M.D.   On: 08/31/2022 00:06     PROCEDURES:  Critical Care performed: No  .1-3 Lead EKG Interpretation  Performed by: Irean Hong, MD Authorized by: Irean Hong, MD     Interpretation: abnormal     ECG rate:  115   ECG rate assessment: tachycardic     Rhythm: sinus tachycardia      Ectopy: none     Conduction: normal   Comments:     Patient placed on cardiac monitor to evaluate for arrhythmias    MEDICATIONS ORDERED IN ED: Medications  sodium chloride 0.9 % bolus 1,000 mL (0 mLs Intravenous Stopped 08/31/22 0041)  LORazepam (ATIVAN) injection 2 mg (2 mg Intravenous Given 08/30/22 2332)  haloperidol lactate (HALDOL) injection 5 mg (5 mg Intravenous Given 08/30/22 2349)  cephALEXin (KEFLEX) capsule 500 mg (500 mg Oral Given 08/31/22 0405)     IMPRESSION / MDM / ASSESSMENT  AND PLAN / ED COURSE  I reviewed the triage vital signs and the nursing notes.                             26 year old female presenting with staring, generalized shaking, following commands.  Posterior diagnosis includes but is not limited to absence seizure, nonepileptic seizure, anxiety, infectious, metabolic, toxicological etiologies, etc.  I personally reviewed patient's records and note a neurology office visit on 12/20/2021 for absence seizure.  Note mentions patient had a single seizure in the spring 2021.  MRI at that time was negative per the patient and she was not started on antiseizure medication.  Patient had EEG scheduled, no antiepileptic prescribed at that visit and was cleared to drive.  Patient's presentation is most consistent with acute presentation with potential threat to life or bodily function.  The patient is on the cardiac monitor to evaluate for evidence of arrhythmia and/or significant heart rate changes.  We will obtain lab work, CT head.  Administer 2 mg IV Ativan.  Will reassess.  Clinical Course as of 08/31/22 0441  Thu Aug 31, 2022  0010 Patient sleeping in no acute distress.  Mother at bedside.  Agreed mother why patient saw neurology 2 years after seizure episode.  Mother states it is because patient had her license suspended and in order to reinstate her license she was made to see a neurologist who cleared her to drive.  Mother reports recent stressor  -patient's grandfather diagnosed with stage IV lung cancer.  Mother updated on plan for lab work and imaging. [JS]  [id number] Patient awake, just ambulated to the toilet.  Responding verbally.  No shaking.  Updated patient and mother of negative CT head, chest x-ray, normal WBC 6.7, normal electrolytes, negative acetaminophen/salicylate/EtOH.  Patient urinated, awaiting urine specimen. [JS]  0440 Patient was placed on Keflex for UTI and Ativan to use as needed.  Mother and patient updated of all outstanding test results.  Advised patient not to drive until cleared by her neurologist.  Strict return precautions were given.  Patient and mother verbalized understanding and was discharged in good and stable condition. [JS]    Clinical Course User Index [JS] Irean Hong, MD     FINAL CLINICAL IMPRESSION(S) / ED DIAGNOSES   Final diagnoses:  Psychogenic nonepileptic seizure  Marijuana use  Lower urinary tract infectious disease     Rx / DC Orders   ED Discharge Orders          Ordered    cephALEXin (KEFLEX) 500 MG capsule  3 times daily        08/31/22 0350    LORazepam (ATIVAN) 1 MG tablet  Every 8 hours PRN        08/31/22 0350             Note:  This document was prepared using Dragon voice recognition software and may include unintentional dictation errors.   Irean Hong, MD 08/31/22 [phone]

## 2022-08-30 NOTE — ED Provider Notes (Incomplete)
[facility] Provider Note    Event Date/Time   First MD Initiated Contact with Patient 08/30/22 2327     (approximate)   History   Seizures   HPI  Level V caveat: Patient nonverbal  Tiffany Flynn is a 26 y.o. female brought to the ED via EMS from work with a chief complaint of possible seizure.  Coworkers found patient staring with shaking activity of her upper limbs.  She was standing and did not fall.  EMS administered IM Versed.  History of anxiety.  No history of seizures.  Rest of history is limited secondary to patient's nonverbal state.     Past Medical History   Past Medical History:  Diagnosis Date  . Legally blind in left eye, as defined in [place]      Active Problem List  There are no problems to display for this patient.    Past Surgical History   Past Surgical History:  Procedure Laterality Date  . WISDOM TOOTH EXTRACTION       Home Medications   Prior to Admission medications   Medication Sig Start Date End Date Taking? Authorizing Provider  ALPRAZolam Prudy Feeler) 0.5 MG tablet Take 1 tablet (0.5 mg total) by mouth 3 (three) times daily as needed for anxiety. Patient not taking: Reported on 05/24/2019 06/22/18   Geoffery Lyons, MD  ibuprofen (ADVIL) 200 MG tablet Take 800 mg by mouth every 6 (six) hours as needed for moderate pain.    [provider]     Allergies  Tomato and Strawberry (diagnostic)   Family History   Family History  Problem Relation Age of Onset  . Healthy Mother   . Healthy Father      Physical Exam  Triage Vital Signs: ED Triage Vitals  Enc Vitals Group     BP      Pulse      Resp      Temp      Temp src      SpO2      Weight      Height      Head Circumference      Peak Flow      Pain Score      Pain Loc      Pain Edu?      Excl. in GC?     Updated Vital Signs: There were no vitals taken for this visit.   General: Awake, no distress.  Eyes open and staring, nonverbal  but nods head yes or no to questioning and follows commands.  Generalized shaking. CV:  ***Good peripheral perfusion.  Resp:  Normal effort.  Abd:  No distention.  Other:  Eyes open spontaneously and staring.  Cannot or will not speak.  Generalized shaking but following commands and nodding head yes or no to questions. MAEx4.   ED Results / Procedures / Treatments  Labs (all labs ordered are listed, but only abnormal results are displayed) Labs Reviewed  CBC  BASIC METABOLIC PANEL  ETHANOL  ACETAMINOPHEN LEVEL  SALICYLATE LEVEL  URINALYSIS, ROUTINE W REFLEX MICROSCOPIC  URINE DRUG SCREEN, QUALITATIVE ([facility] ONLY)  POC URINE PREG, ED     EKG  ED ECG REPORT I, Najmah Carradine J, the attending physician, personally viewed and interpreted this ECG.   Date: 08/30/2022  EKG Time: 2333  Rate: 112  Rhythm: sinus tachycardia  Axis: LAD  Intervals:none  ST&T Change: Nonspecific    RADIOLOGY I have independently visualized and interpreted patient's CT  and chest x-ray as well as noted the radiology interpretation:  CT head:  Chest x-ray:  Official radiology report(s): No results found.   PROCEDURES:  Critical Care performed: {CriticalCareYesNo:19197::"Yes, see critical care procedure note(s)","No"}  .1-3 Lead EKG Interpretation  Performed by: Irean Hong, MD Authorized by: Irean Hong, MD   Comments:     Patient placed on cardiac monitor to evaluate for arrhythmias    MEDICATIONS ORDERED IN ED: Medications  sodium chloride 0.9 % bolus 1,000 mL (has no administration in time range)  LORazepam (ATIVAN) injection 2 mg (has no administration in time range)     IMPRESSION / MDM / ASSESSMENT AND PLAN / ED COURSE  I reviewed the triage vital signs and the nursing notes.                             26 year old female presenting with staring, generalized shaking, following commands.  Posterior diagnosis includes but is not limited to absence seizure, nonepileptic  seizure, anxiety, infectious, metabolic, toxicological etiologies, etc.  I personally reviewed patient's records and note a neurology office visit on 12/20/2021 for absence seizure.  Note mentions patient had a single seizure in the spring 2021.  MRI at that time was negative per the patient and she was not started on antiseizure medication.  Patient had EEG scheduled, no antiepileptic prescribed at that visit and was cleared to drive.  Patient's presentation is most consistent with acute presentation with potential threat to life or bodily function.  The patient is on the cardiac monitor to evaluate for evidence of arrhythmia and/or significant heart rate changes.  We will obtain lab work, CT head.  Administer 2 mg IV Ativan.  Will reassess.      FINAL CLINICAL IMPRESSION(S) / ED DIAGNOSES   Final diagnoses:  Psychogenic nonepileptic seizure     Rx / DC Orders   ED Discharge Orders     None        Note:  This document was prepared using Dragon voice recognition software and may include unintentional dictation errors.

## 2022-08-30 NOTE — ED Notes (Signed)
XR at bedside

## 2022-08-30 NOTE — ED Triage Notes (Signed)
Pt BIB EMS d/t concern for possible seizures.   At work absent seizure   5 versed IV   CBG 91

## 2022-08-31 ENCOUNTER — Emergency Department: Payer: Self-pay

## 2022-08-31 LAB — BASIC METABOLIC PANEL
Anion gap: 5 (ref 5–15)
BUN: 7 mg/dL (ref 6–20)
CO2: 22 mmol/L (ref 22–32)
Calcium: 8.3 mg/dL — ABNORMAL LOW (ref 8.9–10.3)
Chloride: 112 mmol/L — ABNORMAL HIGH (ref 98–111)
Creatinine, Ser: 0.65 mg/dL (ref 0.44–1.00)
GFR, Estimated: >60 mL/min (ref >60)
Glucose, Bld: 74 mg/dL (ref 70–99)
Potassium: 3.5 mmol/L (ref 3.5–5.1)
Sodium: 139 mmol/L (ref 135–145)

## 2022-08-31 LAB — URINE DRUG SCREEN, QUALITATIVE (ARMC ONLY)
Amphetamines, Ur Screen: NOT DETECTED
Barbiturates, Ur Screen: NOT DETECTED
Benzodiazepine, Ur Scrn: POSITIVE — AB
Cannabinoid 50 Ng, Ur ~~LOC~~: POSITIVE — AB
Cocaine Metabolite,Ur ~~LOC~~: NOT DETECTED
MDMA (Ecstasy)Ur Screen: NOT DETECTED
Methadone Scn, Ur: NOT DETECTED
Opiate, Ur Screen: NOT DETECTED
Phencyclidine (PCP) Ur S: NOT DETECTED
Tricyclic, Ur Screen: NOT DETECTED

## 2022-08-31 LAB — URINALYSIS, ROUTINE W REFLEX MICROSCOPIC
Bilirubin Urine: NEGATIVE
Glucose, UA: NEGATIVE mg/dL
Hgb urine dipstick: NEGATIVE
Ketones, ur: NEGATIVE mg/dL
Nitrite: POSITIVE — AB
Protein, ur: NEGATIVE mg/dL
Specific Gravity, Urine: 1.011 (ref 1.005–1.030)
pH: 5 (ref 5.0–8.0)

## 2022-08-31 LAB — SALICYLATE LEVEL: Salicylate Lvl: <7 mg/dL — ABNORMAL LOW (ref 7.0–30.0)

## 2022-08-31 LAB — ACETAMINOPHEN LEVEL: Acetaminophen (Tylenol), Serum: <10 ug/mL — ABNORMAL LOW (ref 10–30)

## 2022-08-31 LAB — ETHANOL: Alcohol, Ethyl (B): <10 mg/dL (ref <10)

## 2022-08-31 LAB — HCG, QUANTITATIVE, PREGNANCY: hCG, Beta Chain, Quant, S: 1 m[IU]/mL (ref <5)

## 2022-08-31 MED ORDER — CEPHALEXIN 500 MG PO CAPS
500.0000 mg | ORAL_CAPSULE | Freq: Once | ORAL | Status: AC
  Administered 2022-08-31: 500 mg via ORAL
  Filled 2022-08-31: qty 1

## 2022-08-31 MED ORDER — CEPHALEXIN 500 MG PO CAPS: 500.0000 mg | ORAL_CAPSULE | Freq: Three times a day (TID) | ORAL | 0 refills | Status: AC

## 2022-08-31 MED ORDER — LORAZEPAM 1 MG PO TABS: 1.0000 mg | ORAL_TABLET | Freq: Three times a day (TID) | ORAL | 0 refills | Status: AC | PRN

## 2022-08-31 NOTE — ED Notes (Signed)
Pt ambulated independently to and from the bathroom. This RN on standby. Urine collected and sent to the lab

## 2022-08-31 NOTE — ED Notes (Signed)
Pending labs recollected and sent to laboratory.

## 2022-08-31 NOTE — Discharge Instructions (Signed)
1.  Take and finish antibiotic as prescribed (Keflex 500mg  3 times daily x7 days). 2.  You may take Lorazepam 3 times daily as needed for stress/anxiety. 3.  Do not drive or operate heavy machinery until you are cleared by your neurologist.  Return to the ER for worsening symptoms, persistent vomiting, difficulty breathing or other concerns.

## 2023-06-12 DIAGNOSIS — R110 Nausea: Secondary | ICD-10-CM | POA: Diagnosis not present

## 2023-06-12 DIAGNOSIS — J069 Acute upper respiratory infection, unspecified: Secondary | ICD-10-CM | POA: Diagnosis not present

## 2023-06-25 DIAGNOSIS — R0602 Shortness of breath: Secondary | ICD-10-CM | POA: Diagnosis not present

## 2023-06-25 DIAGNOSIS — J0100 Acute maxillary sinusitis, unspecified: Secondary | ICD-10-CM | POA: Diagnosis not present

## 2023-06-25 DIAGNOSIS — J029 Acute pharyngitis, unspecified: Secondary | ICD-10-CM | POA: Diagnosis not present

## 2023-10-17 DIAGNOSIS — J069 Acute upper respiratory infection, unspecified: Secondary | ICD-10-CM | POA: Diagnosis not present
